# Patient Record
Sex: Female | Born: 1970 | Race: Black or African American | Hispanic: No | Marital: Single | Smoking: Never smoker
Health system: Southern US, Community
[De-identification: ages and names within clinical notes are randomized; demographics above are authoritative.]

## PROBLEM LIST (undated history)

## (undated) DIAGNOSIS — J45909 Unspecified asthma, uncomplicated: Secondary | ICD-10-CM

## (undated) DIAGNOSIS — D869 Sarcoidosis, unspecified: Secondary | ICD-10-CM

## (undated) DIAGNOSIS — E119 Type 2 diabetes mellitus without complications: Secondary | ICD-10-CM

## (undated) DIAGNOSIS — I10 Essential (primary) hypertension: Secondary | ICD-10-CM

## (undated) DIAGNOSIS — J449 Chronic obstructive pulmonary disease, unspecified: Secondary | ICD-10-CM

---

## 2021-07-30 ENCOUNTER — Emergency Department (HOSPITAL_BASED_OUTPATIENT_CLINIC_OR_DEPARTMENT_OTHER)
Admission: EM | Admit: 2021-07-30 | Discharge: 2021-07-30 | Disposition: A | Discharge status: Home / Self Care | Payer: Medicare Other | Attending: Emergency Medicine | Admitting: Emergency Medicine

## 2021-07-30 ENCOUNTER — Encounter (HOSPITAL_BASED_OUTPATIENT_CLINIC_OR_DEPARTMENT_OTHER): Payer: Self-pay

## 2021-07-30 ENCOUNTER — Other Ambulatory Visit: Payer: Self-pay

## 2021-07-30 ENCOUNTER — Emergency Department (HOSPITAL_BASED_OUTPATIENT_CLINIC_OR_DEPARTMENT_OTHER): Payer: Medicare Other

## 2021-07-30 DIAGNOSIS — Z20822 Contact with and (suspected) exposure to covid-19: Secondary | ICD-10-CM | POA: Diagnosis not present

## 2021-07-30 DIAGNOSIS — I10 Essential (primary) hypertension: Secondary | ICD-10-CM | POA: Insufficient documentation

## 2021-07-30 DIAGNOSIS — Z9981 Dependence on supplemental oxygen: Secondary | ICD-10-CM | POA: Diagnosis not present

## 2021-07-30 DIAGNOSIS — R000 Tachycardia, unspecified: Secondary | ICD-10-CM | POA: Insufficient documentation

## 2021-07-30 DIAGNOSIS — J45909 Unspecified asthma, uncomplicated: Secondary | ICD-10-CM | POA: Diagnosis not present

## 2021-07-30 DIAGNOSIS — D869 Sarcoidosis, unspecified: Secondary | ICD-10-CM | POA: Diagnosis not present

## 2021-07-30 DIAGNOSIS — R0602 Shortness of breath: Secondary | ICD-10-CM | POA: Diagnosis present

## 2021-07-30 DIAGNOSIS — D860 Sarcoidosis of lung: Secondary | ICD-10-CM

## 2021-07-30 DIAGNOSIS — E119 Type 2 diabetes mellitus without complications: Secondary | ICD-10-CM | POA: Insufficient documentation

## 2021-07-30 DIAGNOSIS — U071 COVID-19: Secondary | ICD-10-CM

## 2021-07-30 DIAGNOSIS — J449 Chronic obstructive pulmonary disease, unspecified: Secondary | ICD-10-CM | POA: Insufficient documentation

## 2021-07-30 HISTORY — DX: Chronic obstructive pulmonary disease, unspecified: J44.9

## 2021-07-30 HISTORY — DX: Essential (primary) hypertension: I10

## 2021-07-30 HISTORY — DX: Type 2 diabetes mellitus without complications: E11.9

## 2021-07-30 HISTORY — DX: Unspecified asthma, uncomplicated: J45.909

## 2021-07-30 HISTORY — DX: Sarcoidosis, unspecified: D86.9

## 2021-07-30 LAB — COMPREHENSIVE METABOLIC PANEL
ALT: 11 U/L (ref 0–44)
AST: 19 U/L (ref 15–41)
Albumin: 4 g/dL (ref 3.5–5.0)
Alkaline Phosphatase: 111 U/L (ref 38–126)
Anion gap: 10 (ref 5–15)
BUN: 6 mg/dL (ref 6–20)
CO2: 30 mmol/L (ref 22–32)
Calcium: 9.1 mg/dL (ref 8.9–10.3)
Chloride: 96 mmol/L — ABNORMAL LOW (ref 98–111)
Creatinine, Ser: 0.61 mg/dL (ref 0.44–1.00)
GFR, Estimated: >60 mL/min (ref >60)
Glucose, Bld: 140 mg/dL — ABNORMAL HIGH (ref 70–99)
Potassium: 3.3 mmol/L — ABNORMAL LOW (ref 3.5–5.1)
Sodium: 136 mmol/L (ref 135–145)
Total Bilirubin: 0.4 mg/dL (ref 0.3–1.2)
Total Protein: 7.9 g/dL (ref 6.5–8.1)

## 2021-07-30 LAB — CBC WITH DIFFERENTIAL/PLATELET
Abs Immature Granulocytes: 0.04 10*3/uL (ref 0.00–0.07)
Basophils Absolute: 0 10*3/uL (ref 0.0–0.1)
Basophils Relative: 0 %
Eosinophils Absolute: 0.1 10*3/uL (ref 0.0–0.5)
Eosinophils Relative: 1 %
HCT: 42 % (ref 36.0–46.0)
Hemoglobin: 13.2 g/dL (ref 12.0–15.0)
Immature Granulocytes: 1 %
Lymphocytes Relative: 12 %
Lymphs Abs: 0.9 10*3/uL (ref 0.7–4.0)
MCH: 24.4 pg — ABNORMAL LOW (ref 26.0–34.0)
MCHC: 31.4 g/dL (ref 30.0–36.0)
MCV: 77.6 fL — ABNORMAL LOW (ref 80.0–100.0)
Monocytes Absolute: 1.2 10*3/uL — ABNORMAL HIGH (ref 0.1–1.0)
Monocytes Relative: 16 %
Neutro Abs: 5.3 10*3/uL (ref 1.7–7.7)
Neutrophils Relative %: 70 %
Platelets: 322 10*3/uL (ref 150–400)
RBC: 5.41 MIL/uL — ABNORMAL HIGH (ref 3.87–5.11)
RDW: 14.4 % (ref 11.5–15.5)
WBC: 7.5 10*3/uL (ref 4.0–10.5)
nRBC: 0 % (ref 0.0–0.2)

## 2021-07-30 LAB — BRAIN NATRIURETIC PEPTIDE: B Natriuretic Peptide: 38.1 pg/mL (ref 0.0–100.0)

## 2021-07-30 LAB — RESP PANEL BY RT-PCR (FLU A&B, COVID) ARPGX2
Influenza A by PCR: NEGATIVE
Influenza B by PCR: NEGATIVE
SARS Coronavirus 2 by RT PCR: POSITIVE — AB

## 2021-07-30 LAB — D-DIMER, QUANTITATIVE: D-Dimer, Quant: 0.34 ug/mL-FEU (ref 0.00–0.50)

## 2021-07-30 MED ORDER — IPRATROPIUM-ALBUTEROL 0.5-2.5 (3) MG/3ML IN SOLN
3.0000 mL | Freq: Once | RESPIRATORY_TRACT | Status: AC
  Administered 2021-07-30: 3 mL via RESPIRATORY_TRACT
  Filled 2021-07-30: qty 3

## 2021-07-30 MED ORDER — PREDNISONE 20 MG PO TABS: 60.0000 mg | ORAL_TABLET | Freq: Every day | ORAL | 0 refills | Status: AC

## 2021-07-30 NOTE — ED Provider Notes (Signed)
[facility] EMERGENCY DEPARTMENT Provider Note   CSN: [id number] Arrival date & time: 07/30/21  1497     History Chief Complaint  Patient presents with   Shortness of Breath    Kimberly Carroll is a 50 y.o. female.  History of sarcoidosis and COPD.  She wears between 2 and 5 L of oxygen.  She has been sick with a cough, sore throat, and increased shortness of breath x2 weeks.  She is vaccinated for COVID-19 and boosted x1.  The history is provided by the patient.  Cough Cough characteristics:  Productive Sputum characteristics:  Yellow Severity:  Moderate Onset quality:  Gradual Duration:  2 weeks Timing:  Intermittent Progression:  Unchanged Chronicity:  New Smoker: no   Context: not sick contacts   Relieved by:  Nothing Worsened by:  Nothing Ineffective treatments:  Beta-agonist inhaler Associated symptoms: shortness of breath and sore throat   Associated symptoms: no chest pain, no chills, no ear pain, no fever and no rash       Past Medical History:  Diagnosis Date   Asthma    COPD (chronic obstructive pulmonary disease) (HCC)    Diabetes mellitus without complication (HCC)    Hypertension    Sarcoidosis     There are no problems to display for this patient.   History reviewed. No pertinent surgical history.   OB History   No obstetric history on file.     No family history on file.  Social History   Tobacco Use   Smoking status: Never   Smokeless tobacco: Never  Substance Use Topics   Alcohol use: Never   Drug use: Never    Home Medications Prior to Admission medications   Not on File    Allergies    Benazepril hcl  Review of Systems   Review of Systems  Constitutional:  Negative for chills and fever.  HENT:  Positive for sore throat. Negative for ear pain.   Eyes:  Negative for pain and visual disturbance.  Respiratory:  Positive for cough and shortness of breath.   Cardiovascular:  Negative for chest pain and  palpitations.  Gastrointestinal:  Negative for abdominal pain and vomiting.  Genitourinary:  Negative for dysuria and hematuria.  Musculoskeletal:  Negative for arthralgias and back pain.  Skin:  Negative for color change and rash.  Neurological:  Negative for seizures and syncope.  All other systems reviewed and are negative.  Physical Exam Updated Vital Signs BP (!) 165/100 (BP Location: Right Arm)   Pulse (!) 107   Temp 100 F (37.8 C) (Oral)   Resp 18   Ht 5\' 3"  (1.6 m)   Wt 85.3 kg   SpO2 100%   BMI 33.30 kg/m   Physical Exam Vitals and nursing note reviewed.  Constitutional:      Appearance: She is well-developed.  HENT:     Head: Normocephalic and atraumatic.  Cardiovascular:     Rate and Rhythm: Regular rhythm. Tachycardia present.     Heart sounds: Normal heart sounds.  Pulmonary:     Effort: Pulmonary effort is normal. No tachypnea.     Breath sounds: Examination of the right-upper field reveals wheezing. Examination of the left-upper field reveals wheezing. Examination of the right-middle field reveals wheezing. Examination of the left-middle field reveals wheezing. Examination of the right-lower field reveals wheezing. Examination of the left-lower field reveals wheezing. Wheezing present.  Abdominal:     Palpations: Abdomen is soft.     Tenderness: There is  no abdominal tenderness.  Musculoskeletal:     Right lower leg: No edema.     Left lower leg: No edema.  Skin:    General: Skin is warm and dry.  Neurological:     General: No focal deficit present.     Mental Status: She is alert and oriented to person, place, and time.  Psychiatric:        Mood and Affect: Mood normal.        Behavior: Behavior normal.    ED Results / Procedures / Treatments   Labs (all labs ordered are listed, but only abnormal results are displayed) Labs Reviewed  RESP PANEL BY RT-PCR (FLU A&B, COVID) ARPGX2 - Abnormal; Notable for the following components:      Result Value    SARS Coronavirus 2 by RT PCR POSITIVE (*)    All other components within normal limits  CBC WITH DIFFERENTIAL/PLATELET - Abnormal; Notable for the following components:   RBC 5.41 (*)    MCV 77.6 (*)    MCH 24.4 (*)    Monocytes Absolute 1.2 (*)    All other components within normal limits  COMPREHENSIVE METABOLIC PANEL - Abnormal; Notable for the following components:   Potassium 3.3 (*)    Chloride 96 (*)    Glucose, Bld 140 (*)    All other components within normal limits  BRAIN NATRIURETIC PEPTIDE  D-DIMER, QUANTITATIVE    EKG EKG Interpretation  Date/Time:  Sunday July 30 2021 10:21:58 EDT Ventricular Rate:  105 PR Interval:  143 QRS Duration: 81 QT Interval:  337 QTC Calculation: 446 R Axis:   34 Text Interpretation: Sinus tachycardia Biatrial enlargement normal axis No acute ischemia Confirmed by Pieter Partridge ([id number]) on 07/30/2021 10:27:44 AM  Radiology DG Chest Port 1 View  Result Date: 07/30/2021 CLINICAL DATA:  Fever EXAM: PORTABLE CHEST 1 VIEW COMPARISON:  None. FINDINGS: Normal mediastinum and cardiac silhouette. Normal pulmonary vasculature. No evidence of effusion, infiltrate, or pneumothorax. No acute bony abnormality. IMPRESSION: No acute cardiopulmonary process. Electronically Signed   By: Genevive Bi M.D.   On: 07/30/2021 11:09    Procedures Procedures   Medications Ordered in ED Medications  ipratropium-albuterol (DUONEB) 0.5-2.5 (3) MG/3ML nebulizer solution 3 mL (3 mLs Nebulization Given 07/30/21 1048)    ED Course  I have reviewed the triage vital signs and the nursing notes.  Pertinent labs & imaging results that were available during my care of the patient were reviewed by me and considered in my medical decision making (see chart for details).    MDM Rules/Calculators/A&P                           Kimberly Carroll presents with increased shortness of breath, productive cough x2 weeks.  She has a history of sarcoidosis and COPD and wears  oxygen continuously.  O2 need is not increased over baseline.  She was wheezing and was given a treatment.  Chest x-ray within normal limits.  She tested positive for COVID-19 which is likely the cause of her symptoms.  She is not a candidate for treatment given her presentation 2 weeks into her illness.  I will give her a course of prednisone given her underlying lung condition, and I have recommended she follow closely with her primary care provider.  No evidence of PE or ACS to based on ED evaluation with D-dimer and EKG respectively.  Chest pain not a significant component of her presentation,  and troponins were not ordered. Final Clinical Impression(s) / ED Diagnoses Final diagnoses:  COVID-19  Pulmonary sarcoidosis (HCC)  Chronic obstructive pulmonary disease, unspecified COPD type (HCC)  Dependence on continuous supplemental oxygen    Rx / DC Orders ED Discharge Orders          Ordered    predniSONE (DELTASONE) 20 MG tablet  Daily        07/30/21 1147             Koleen Distance, MD 07/30/21 1149

## 2021-07-30 NOTE — ED Triage Notes (Signed)
Pt arrives ambulatory to ED with c/o worsening SOB, cough, and scratchy throat X2 Days. Pt has COPD and wears 5L [place] when walking and 2L [place] at rest. Pt placed on wall O2 at 2 L after ambulating to room from triage with O2 reading of 96%, 98% after sitting.

## 2021-07-30 NOTE — ED Notes (Signed)
Assessed PT c/o SOB while on 2 lpm (2 LPM dep at home). Current Sp02 96%, slightly SOB, BBS coarse with exp wheeze throughout. Will provide neb treatment per PA Caccavale.

## 2022-05-13 IMAGING — DX DG CHEST 1V PORT
1 series · 1 of 1 positions shown · non-contrast
Comparison: None.

CLINICAL DATA: Fever

EXAM:
PORTABLE CHEST 1 VIEW

[chest ap]
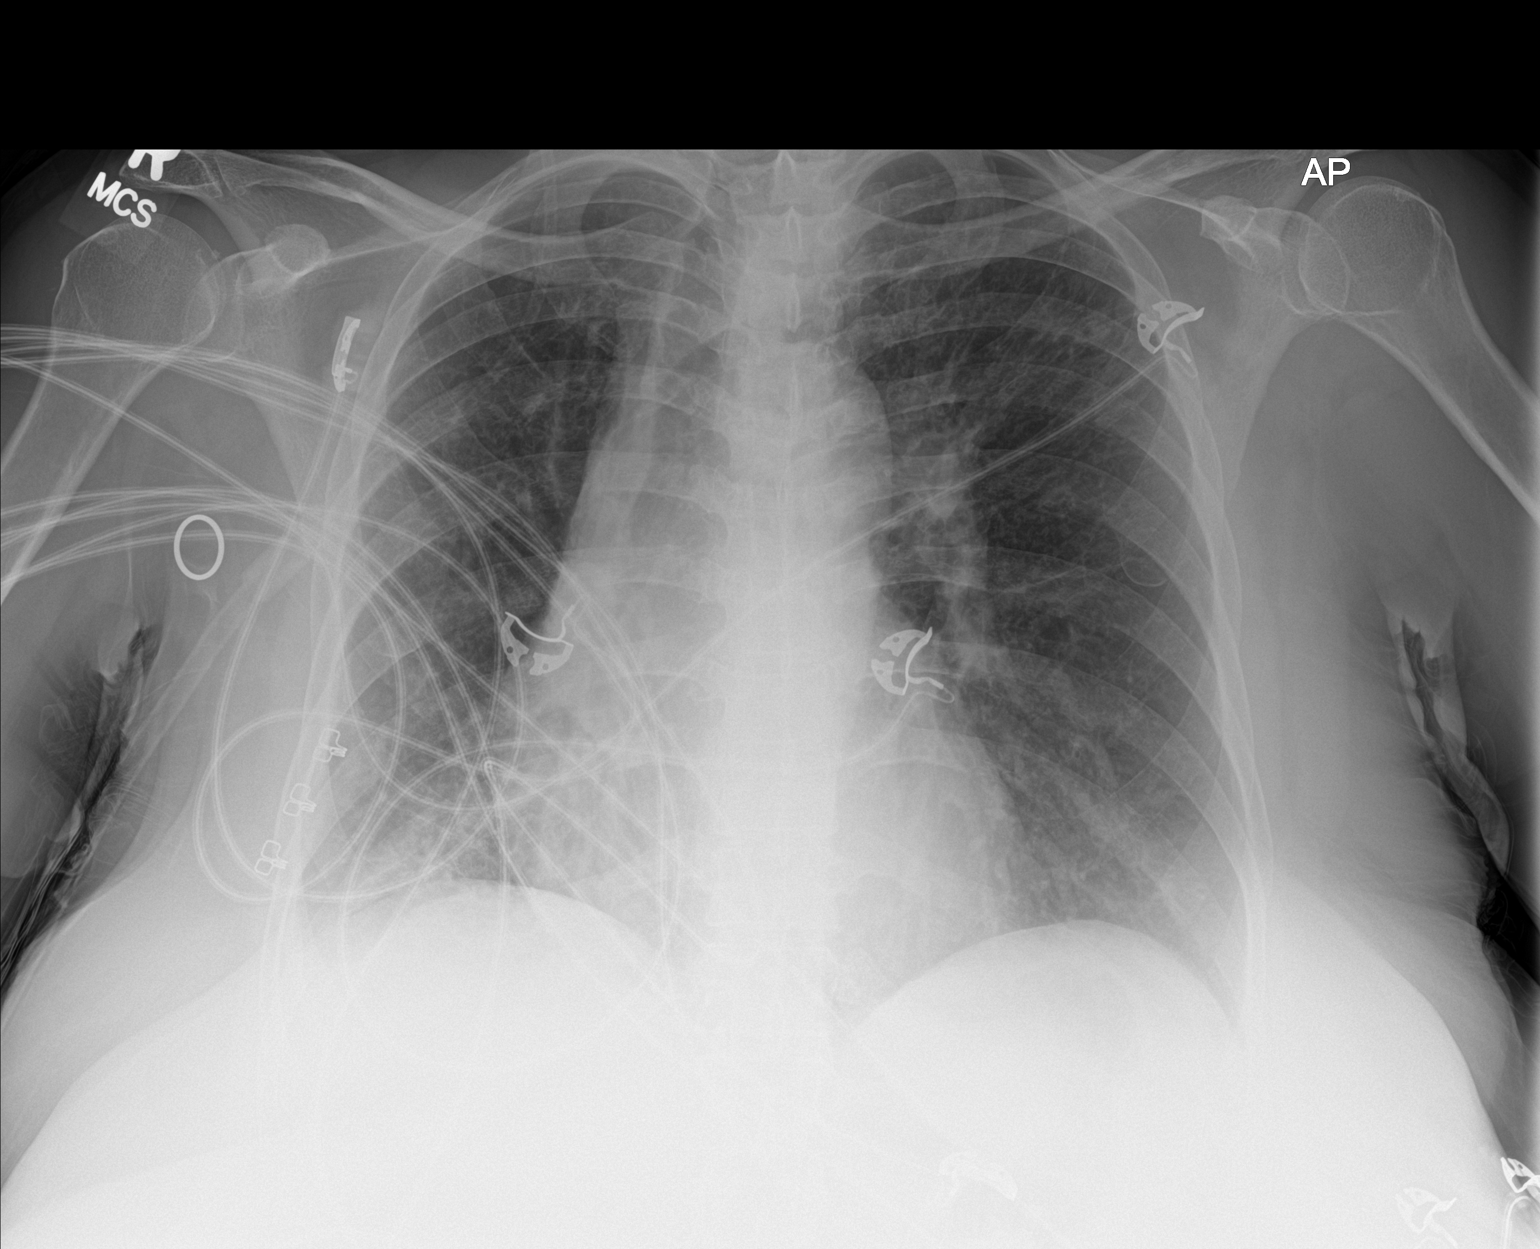

[1 of 1 positions shown; findings below may reference images not displayed]

FINDINGS: Normal mediastinum and cardiac silhouette. Normal pulmonary
vasculature. No evidence of effusion, infiltrate, or pneumothorax.
No acute bony abnormality.
IMPRESSION: No acute cardiopulmonary process.

## 2024-02-15 DEATH — deceased
